# Patient Record
Sex: Female | Born: 1980 | Race: White | Hispanic: No | Marital: Married | Smoking: Never smoker
Health system: Southern US, Community
[De-identification: ages and names within clinical notes are randomized; demographics above are authoritative.]

## PROBLEM LIST (undated history)

## (undated) DIAGNOSIS — Z789 Other specified health status: Secondary | ICD-10-CM

## (undated) HISTORY — DX: Other specified health status: Z78.9

---

## 2008-12-15 HISTORY — PX: WISDOM TOOTH EXTRACTION: SHX21

## 2021-08-22 ENCOUNTER — Ambulatory Visit (INDEPENDENT_AMBULATORY_CARE_PROVIDER_SITE_OTHER): Payer: No Typology Code available for payment source | Admitting: Family Medicine

## 2021-08-22 ENCOUNTER — Encounter: Payer: Self-pay | Admitting: Family Medicine

## 2021-08-22 ENCOUNTER — Encounter: Payer: Self-pay | Admitting: General Practice

## 2021-08-22 ENCOUNTER — Other Ambulatory Visit: Payer: Self-pay

## 2021-08-22 VITALS — BP 135/74 | HR 75 | Ht 66.0 in | Wt 163.0 lb

## 2021-08-22 DIAGNOSIS — Z7689 Persons encountering health services in other specified circumstances: Secondary | ICD-10-CM | POA: Diagnosis not present

## 2021-08-22 DIAGNOSIS — L720 Epidermal cyst: Secondary | ICD-10-CM

## 2021-08-22 NOTE — Progress Notes (Signed)
Patient establishing care Patient had annual visit with [facility] on 06-12-21. Armandina Stammer RN

## 2021-08-22 NOTE — Progress Notes (Signed)
   Subjective:    Patient ID: Pam Solis, female    DOB: 04-Feb-1981, 40 y.o.   MRN: [id number]  HPI Patient is here for to establish care.  She had just had a appointment with an OB/GYN with [facility], but found that they do not cover their insurance.  She reports history of 2 normal vaginal deliveries without difficulties.  She and her husband are looking at becoming pregnant within the next year.  She was on early vaginal progesterone with both of her other pregnancies due to history of recurrent pregnancy loss.  She does have a small lump in the left outside area of her breast close to her left armpit.  Her previous doctor had been following it.  She did have an episode of a becoming inflamed couple months ago.  It is painless, mobile, about 7 to 8 mm in diameter.  I have reviewed the patients past medical, family, and social history.  I have reviewed the patient's medication list and allergies.   Review of Systems     Objective:   Physical Exam Vitals and nursing note reviewed.  Constitutional:      Appearance: Normal appearance.  Chest:    Neurological:     Mental Status: She is alert.       Assessment & Plan:  1. Encounter to establish care Discussed pregnancy care.  Start prenatal vitamin.  Discussed role of vaginal progesterone with that there is no conclusive evidence that it is effective at preventing recurrent pregnancy loss.  However, if the patient is desirous, there is little harm with vaginal progesterone.  2. Inclusion cyst of left breast Reassured patient that this is a benign process and is not related to breast cancer.

## 2021-11-28 ENCOUNTER — Encounter: Payer: Self-pay | Admitting: Family Medicine

## 2021-11-29 MED ORDER — PROGESTERONE 200 MG PO CAPS: ORAL_CAPSULE | ORAL | 1 refills | Status: DC

## 2021-12-15 NOTE — L&D Delivery Note (Addendum)
Delivery Note At 0016, patient progressed to complete and started pushing. At 12:20 AM a viable and healthy female was delivered via Vaginal, Spontaneous (Presentation: Right Occiput Posterior with noted right compound arm).  APGAR: 9, 9; weight pending  .   Placenta status: Spontaneous, Intact.  Cord: 3 vessels with the following complications: Short.    Anesthesia: Local Lidocaine 30 cc Episiotomy: None Lacerations: 2nd degree Suture Repair: 3.0 vicryl in which the laceration was repaired in the usual fashion  Est. Blood Loss (mL): 500 (from lac)  Fundus was firm and bleeding minimal when delivery team left the room  Mom to postpartum.  Baby to Couplet care / Skin to Skin.  Ilda Basset [facility] PGY3 Visiting Resident 08/06/2022, 1:20 AM   DELIVERY ATTESTATION I was gloved and present for the delivery in its entirety, and I agree with the above resident/SNM's note.    Cheral Marker CNM, [facility] 08/06/2022 1:46 AM

## 2022-01-02 ENCOUNTER — Other Ambulatory Visit (HOSPITAL_COMMUNITY)
Admission: RE | Admit: 2022-01-02 | Discharge: 2022-01-02 | Disposition: A | Discharge status: Home / Self Care | Payer: No Typology Code available for payment source | Source: Ambulatory Visit | Attending: Family Medicine | Admitting: Family Medicine

## 2022-01-02 ENCOUNTER — Encounter: Payer: Self-pay | Admitting: Family Medicine

## 2022-01-02 ENCOUNTER — Other Ambulatory Visit: Payer: Self-pay

## 2022-01-02 ENCOUNTER — Ambulatory Visit (INDEPENDENT_AMBULATORY_CARE_PROVIDER_SITE_OTHER): Payer: No Typology Code available for payment source | Admitting: Family Medicine

## 2022-01-02 VITALS — BP 123/68 | HR 79 | Wt 161.0 lb

## 2022-01-02 DIAGNOSIS — Z3A09 9 weeks gestation of pregnancy: Secondary | ICD-10-CM | POA: Insufficient documentation

## 2022-01-02 DIAGNOSIS — O09529 Supervision of elderly multigravida, unspecified trimester: Secondary | ICD-10-CM | POA: Insufficient documentation

## 2022-01-02 DIAGNOSIS — O0990 Supervision of high risk pregnancy, unspecified, unspecified trimester: Secondary | ICD-10-CM | POA: Insufficient documentation

## 2022-01-02 NOTE — Progress Notes (Signed)
Subjective:  Pam Solis is a [id number] [redacted]w[redacted]d being seen today for her first obstetrical visit.  Her obstetrical history is significant for advanced maternal age. 2 prior vaginal deliveries - uncomplicated. Patient does intend to breast feed. Pregnancy history fully reviewed.  Patient reports nausea and vomiting.  BP 123/68    Pulse 79    Wt 161 lb (73 kg)    LMP 10/27/2021    BMI 25.99 kg/m   HISTORY: OB History  Gravida Para Term Preterm AB Living  5 2 2   2 2   SAB IAB Ectopic Multiple Live Births  2       2    # Outcome Date GA Lbr Len/2nd Weight Sex Delivery Anes PTL Lv  5 Current           4 Term 09/02/19    M Vag-Spont     3 Term 12/16/17    F Vag-Spont     2 SAB           1 SAB             Past Medical History:  Diagnosis Date   Medical history non-contributory     Past Surgical History:  Procedure Laterality Date   WISDOM TOOTH EXTRACTION  2010    Family History  Problem Relation Age of Onset   Diabetes Paternal Grandfather    Diabetes Paternal Grandmother    Breast cancer Paternal Grandmother    Cancer Paternal 2011' disease Mother    Cancer Maternal Uncle      Exam  BP 123/68    Pulse 79    Wt 161 lb (73 kg)    LMP 10/27/2021    BMI 25.99 kg/m   Chaperone present during exam  CONSTITUTIONAL: Well-developed, well-nourished female in no acute distress.  HENT:  Normocephalic, atraumatic, External right and left ear normal. Oropharynx is clear and moist EYES: Conjunctivae and EOM are normal. Pupils are equal, round, and reactive to light. No scleral icterus.  NECK: Normal range of motion, supple, no masses.  Normal thyroid.  CARDIOVASCULAR: Normal heart rate noted, regular rhythm RESPIRATORY: Clear to auscultation bilaterally. Effort and breath sounds normal, no problems with respiration noted. BREASTS: had recent breast exam with me ABDOMEN: Soft, normal bowel sounds, no distention noted.  No tenderness, rebound or guarding.   PELVIC: had pelvic exam and PAP earlier this year. MUSCULOSKELETAL: Normal range of motion. No tenderness.  No cyanosis, clubbing, or edema.  2+ distal pulses. SKIN: Skin is warm and dry. No rash noted. Not diaphoretic. No erythema. No pallor. NEUROLOGIC: Alert and oriented to person, place, and time. Normal reflexes, muscle tone coordination. No cranial nerve deficit noted. PSYCHIATRIC: Normal mood and affect. Normal behavior. Normal judgment and thought content.    Assessment:    Pregnancy: 10/29/2021 Patient Active Problem List   Diagnosis Date Noted   Supervision of high risk pregnancy, antepartum 01/02/2022   Antepartum multigravida of advanced maternal age 30/19/2023      Plan:   1. Antepartum multigravida of advanced maternal age Start ASA 81mg  at 12 weeks - Culture, OB Urine - 01/04/2022 MFM OB DETAIL +14 WK; Future - CBC/D/Plt+RPR+Rh+ABO+RubIgG... - GC/Chlamydia probe amp ([place])not at [facility]  2. Supervision of high risk pregnancy, antepartum FHT and FH noraml - Culture, OB Urine - [place] MFM OB DETAIL +14 WK; Future - CBC/D/Plt+RPR+Rh+ABO+RubIgG... - GC/Chlamydia probe amp ([place])not at [facility]  3. [redacted] weeks gestation of pregnancy - Culture, OB Urine -  [place] MFM OB DETAIL +14 WK; Future - CBC/D/Plt+RPR+Rh+ABO+RubIgG... - GC/Chlamydia probe amp ([place])not at [facility]    Initial labs obtained Continue prenatal vitamins Reviewed n/v relief measures and warning s/s to report Reviewed recommended weight gain based on pre-gravid BMI Encouraged well-balanced diet Genetic & carrier screening discussed: Looking into NIPS out of pocket. Ultrasound discussed; fetal survey: requested CCNC completed> form faxed if has or is planning to apply for medicaid The nature of [place] [facility] [employer] with multiple MDs and other Advanced Practice Providers was explained to patient; also emphasized that fellows, residents, and students are part of our team.  Problem list  reviewed and updated. 75% of 30 min visit spent on counseling and coordination of care.     Pam Solis 01/02/2022

## 2022-01-02 NOTE — Progress Notes (Signed)
DATING AND VIABILITY SONOGRAM   Pam Solis is a 41 y.o. year old G5P2022 with LMP Patient's last menstrual period was 10/27/2021. which would correlate to  [redacted]w[redacted]d weeks gestation.  She has regular menstrual cycles.   She is here today for a confirmatory initial sonogram.    GESTATION: SINGLETON     FETAL ACTIVITY:          Heart rate         161 bpm          The fetus is active.  PLACENTA LOCALIZATION:  anterior    ADNEXA: The ovaries are normal.   GESTATIONAL AGE AND  BIOMETRICS:  Gestational criteria: Estimated Date of Delivery: 08/03/22 by LMP now at [redacted]w[redacted]d  Previous Scans:0  CROWN RUMP LENGTH           3.14 cm         10weeks         2.80 cm       9-4 weeks                                                                           AVERAGE EGA(BY THIS SCAN):  9-4 weeks  WORKING EDD( LMP ):  08/03/2022     TECHNICIAN COMMENTS:  Patient informed that the ultrasound is considered a limited obstetric ultrasound and is not intended to be a complete ultrasound exam.  Patient also informed that the ultrasound is not being completed with the intent of assessing for fetal or placental anomalies or any pelvic abnormalities. Explained that the purpose of today's ultrasound is to assess for fetal heart rate.  Patient acknowledges the purpose of the exam and the limitations of the study.     Armandina Stammer 01/02/2022 9:43 AM

## 2022-01-03 LAB — CBC/D/PLT+RPR+RH+ABO+RUBIGG...
Antibody Screen: NEGATIVE
Basophils Absolute: 0 10*3/uL (ref 0.0–0.2)
Basos: 0 %
EOS (ABSOLUTE): 0 10*3/uL (ref 0.0–0.4)
Eos: 0 %
HCV Ab: <0.1 s/co ratio (ref 0.0–0.9)
HIV Screen 4th Generation wRfx: NONREACTIVE
Hematocrit: 34.5 % (ref 34.0–46.6)
Hemoglobin: 11 g/dL — ABNORMAL LOW (ref 11.1–15.9)
Hepatitis B Surface Ag: NEGATIVE
Immature Grans (Abs): 0 10*3/uL (ref 0.0–0.1)
Immature Granulocytes: 0 %
Lymphocytes Absolute: 0.9 10*3/uL (ref 0.7–3.1)
Lymphs: 17 %
MCH: 22.8 pg — ABNORMAL LOW (ref 26.6–33.0)
MCHC: 31.9 g/dL (ref 31.5–35.7)
MCV: 71 fL — ABNORMAL LOW (ref 79–97)
Monocytes Absolute: 0.4 10*3/uL (ref 0.1–0.9)
Monocytes: 7 %
Neutrophils Absolute: 4.1 10*3/uL (ref 1.4–7.0)
Neutrophils: 76 %
Platelets: 304 10*3/uL (ref 150–450)
RBC: 4.83 x10E6/uL (ref 3.77–5.28)
RDW: 17.8 % — ABNORMAL HIGH (ref 11.7–15.4)
RPR Ser Ql: NONREACTIVE
Rh Factor: NEGATIVE
Rubella Antibodies, IGG: 10.3 index (ref >0.99)
WBC: 5.5 10*3/uL (ref 3.4–10.8)

## 2022-01-03 LAB — GC/CHLAMYDIA PROBE AMP (~~LOC~~) NOT AT ARMC
Chlamydia: NEGATIVE
Comment: NEGATIVE
Comment: NORMAL
Neisseria Gonorrhea: NEGATIVE

## 2022-01-03 LAB — HCV INTERPRETATION

## 2022-01-04 LAB — URINE CULTURE, OB REFLEX

## 2022-01-04 LAB — CULTURE, OB URINE

## 2022-01-31 ENCOUNTER — Other Ambulatory Visit: Payer: Self-pay

## 2022-01-31 ENCOUNTER — Ambulatory Visit (INDEPENDENT_AMBULATORY_CARE_PROVIDER_SITE_OTHER): Payer: No Typology Code available for payment source | Admitting: Family Medicine

## 2022-01-31 VITALS — BP 100/73 | HR 95 | Wt 164.0 lb

## 2022-01-31 DIAGNOSIS — O0990 Supervision of high risk pregnancy, unspecified, unspecified trimester: Secondary | ICD-10-CM

## 2022-01-31 DIAGNOSIS — O09529 Supervision of elderly multigravida, unspecified trimester: Secondary | ICD-10-CM

## 2022-01-31 DIAGNOSIS — Z3A13 13 weeks gestation of pregnancy: Secondary | ICD-10-CM

## 2022-01-31 NOTE — Progress Notes (Signed)
° °  PRENATAL VISIT NOTE  Subjective:  Pam Solis is a 41 y.o. [id number] at [redacted]w[redacted]d being seen today for ongoing prenatal care.  She is currently monitored for the following issues for this high-risk pregnancy and has Supervision of high risk pregnancy, antepartum and Antepartum multigravida of advanced maternal age on their problem list.  Patient reports no complaints.  Contractions: Not present. Vag. Bleeding: None.   . Denies leaking of fluid.   The following portions of the patient's history were reviewed and updated as appropriate: allergies, current medications, past family history, past medical history, past social history, past surgical history and problem list.   Objective:   Vitals:   01/31/22 0844  BP: 100/73  Pulse: 95  Weight: 164 lb (74.4 kg)    Fetal Status:           General:  Alert, oriented and cooperative. Patient is in no acute distress.  Skin: Skin is warm and dry. No rash noted.   Cardiovascular: Normal heart rate noted  Respiratory: Normal respiratory effort, no problems with respiration noted  Abdomen: Soft, gravid, appropriate for gestational age.  Pain/Pressure: Absent     Pelvic: Cervical exam deferred        Extremities: Normal range of motion.  Edema: None  Mental Status: Normal mood and affect. Normal behavior. Normal judgment and thought content.   Assessment and Plan:  Pregnancy: [id number] at [redacted]w[redacted]d 1. [redacted] weeks gestation of pregnancy  2. Antepartum multigravida of advanced maternal age 13 and FH normal Start ASA 81mg   3. Supervision of high risk pregnancy, antepartum   Preterm labor symptoms and general obstetric precautions including but not limited to vaginal bleeding, contractions, leaking of fluid and fetal movement were reviewed in detail with the patient. Please refer to After Visit Summary for other counseling recommendations.   No follow-ups on file.  Future Appointments  Date Time Provider [place]  02/28/2022  8:35 AM  Truett Mainland, DO [facility] None  03/12/2022  2:30 PM [facility] NURSE [facility]  03/12/2022  2:45 PM [facility] US4 [facility]  03/28/2022  8:55 AM Anyanwu, Sallyanne Havers, MD [facility] None    Truett Mainland, DO

## 2022-02-25 ENCOUNTER — Encounter: Payer: Self-pay | Admitting: Family Medicine

## 2022-02-28 ENCOUNTER — Encounter: Payer: No Typology Code available for payment source | Admitting: Family Medicine

## 2022-03-12 ENCOUNTER — Ambulatory Visit: Payer: No Typology Code available for payment source | Admitting: *Deleted

## 2022-03-12 ENCOUNTER — Ambulatory Visit: Payer: No Typology Code available for payment source | Attending: Family Medicine

## 2022-03-12 VITALS — BP 122/74 | HR 78

## 2022-03-12 DIAGNOSIS — Z3A09 9 weeks gestation of pregnancy: Secondary | ICD-10-CM | POA: Diagnosis not present

## 2022-03-12 DIAGNOSIS — O0990 Supervision of high risk pregnancy, unspecified, unspecified trimester: Secondary | ICD-10-CM | POA: Diagnosis not present

## 2022-03-12 DIAGNOSIS — O09522 Supervision of elderly multigravida, second trimester: Secondary | ICD-10-CM

## 2022-03-12 DIAGNOSIS — O09529 Supervision of elderly multigravida, unspecified trimester: Secondary | ICD-10-CM | POA: Diagnosis not present

## 2022-03-12 IMAGING — US US MFM OB DETAIL+14 WK
1 series · 13 of 28 positions shown · non-contrast
Comparison: none

[Series 1: us mfm ob detail+14 wk · 13 of 114 slices shown]
[im 5/114]
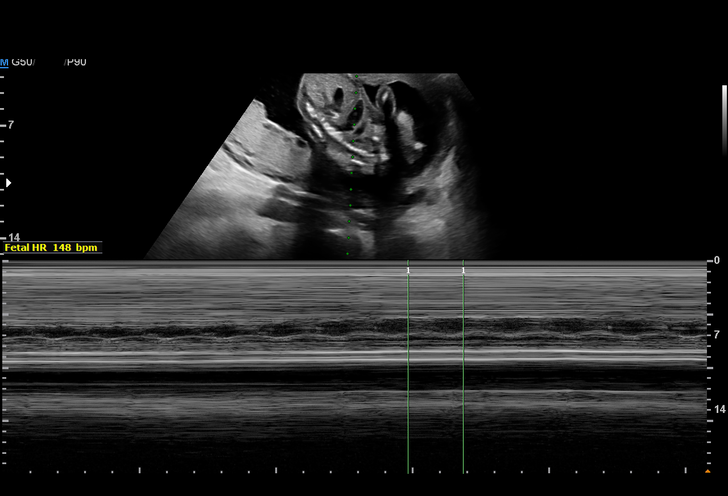
[im 13/114]
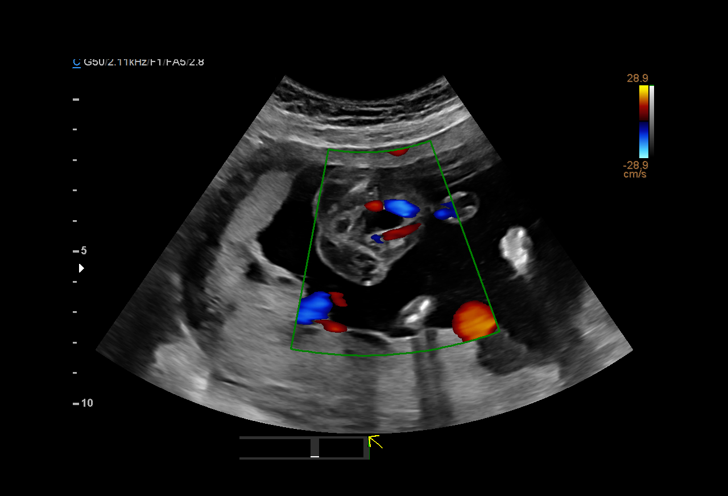
[im 21/114]
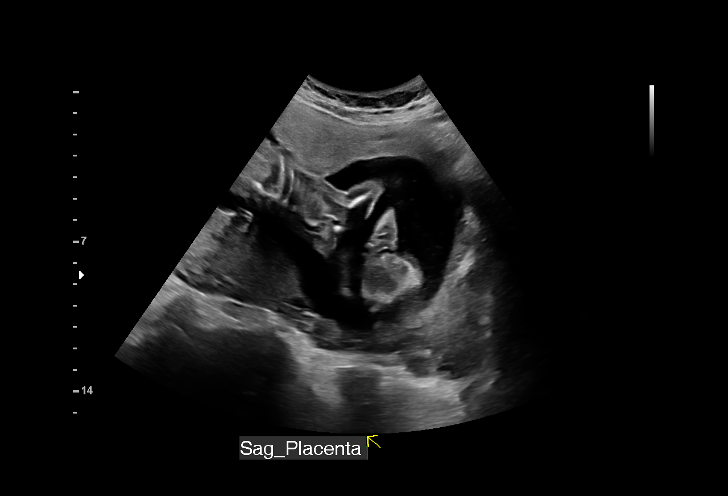
[im 30/114]
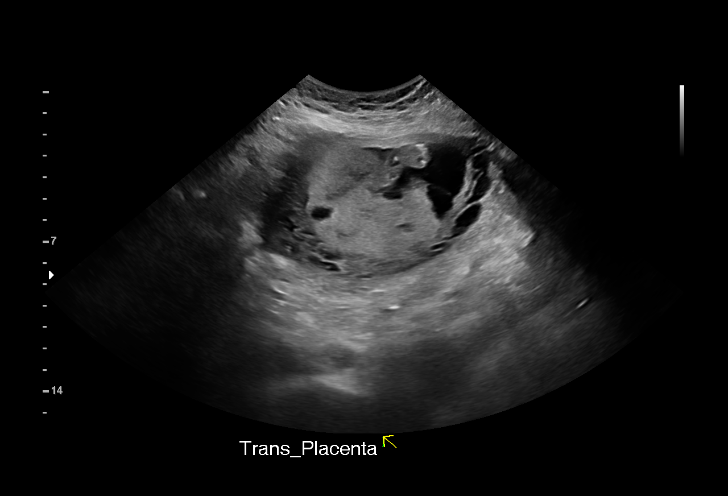
[im 38/114]
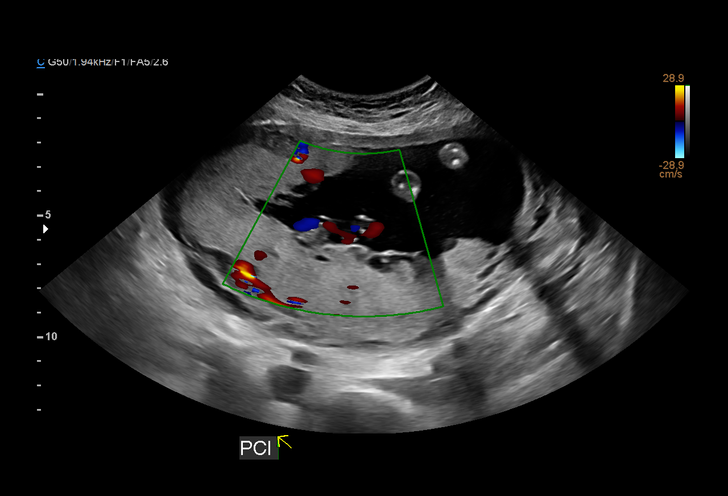
[im 47/114]
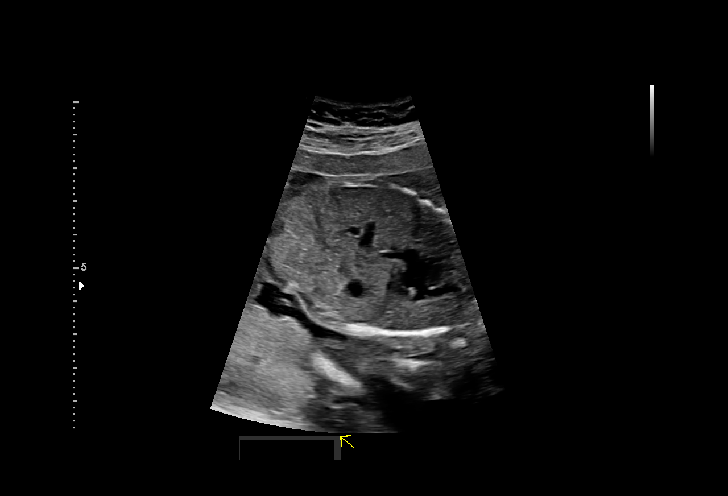
[im 59/114]
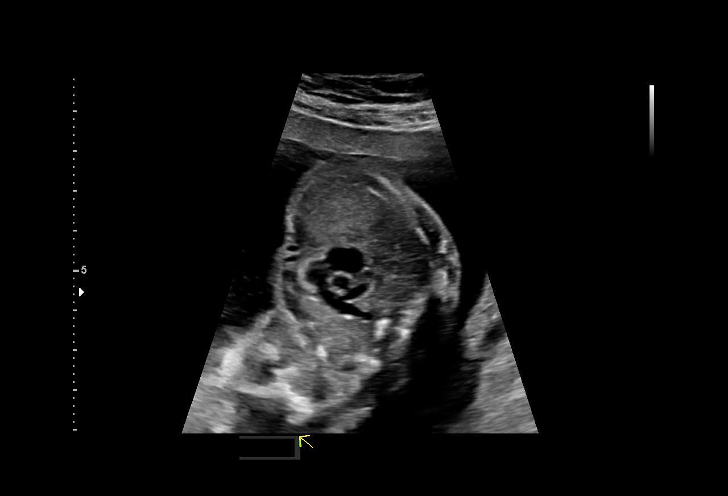
[im 67/114]
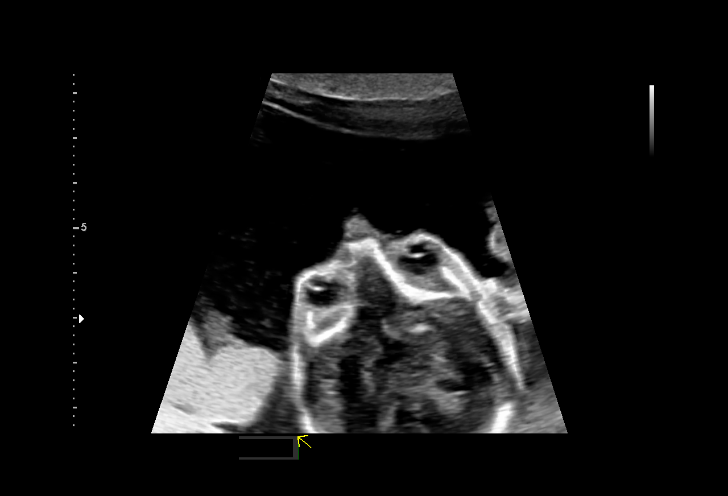
[im 76/114]
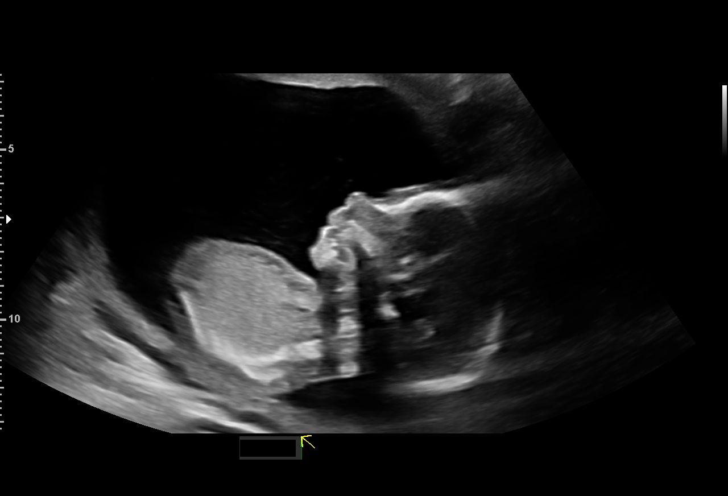
[im 84/114]
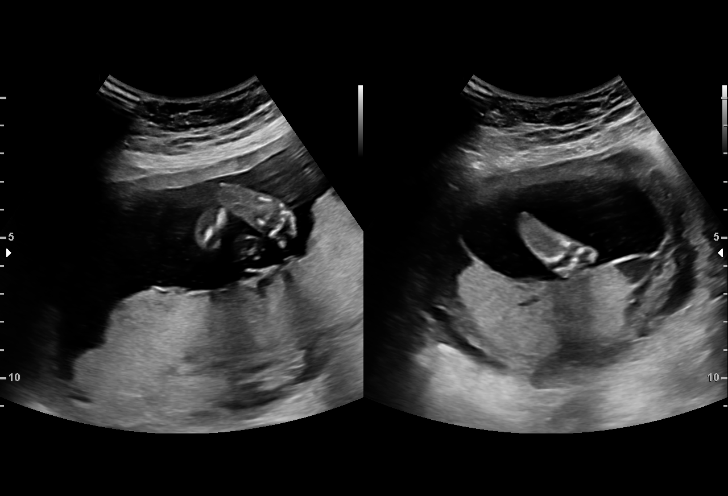
[im 93/114]
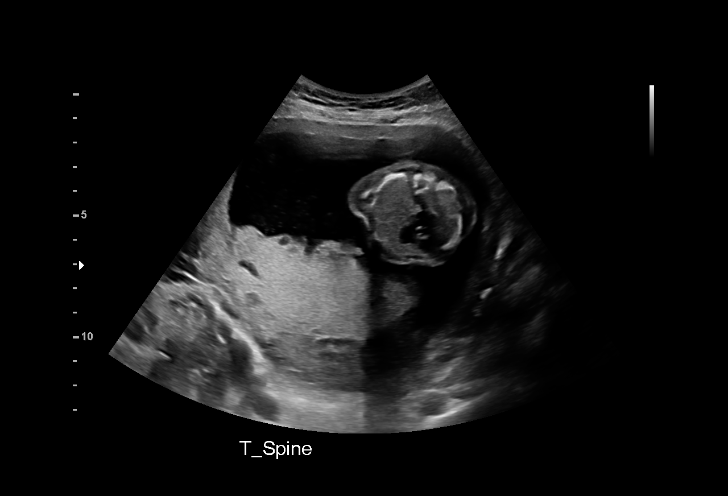
[im 101/114]
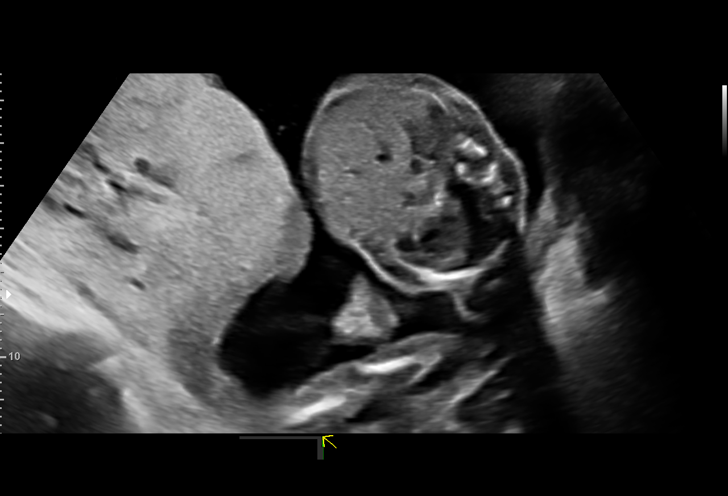
[im 109/114]
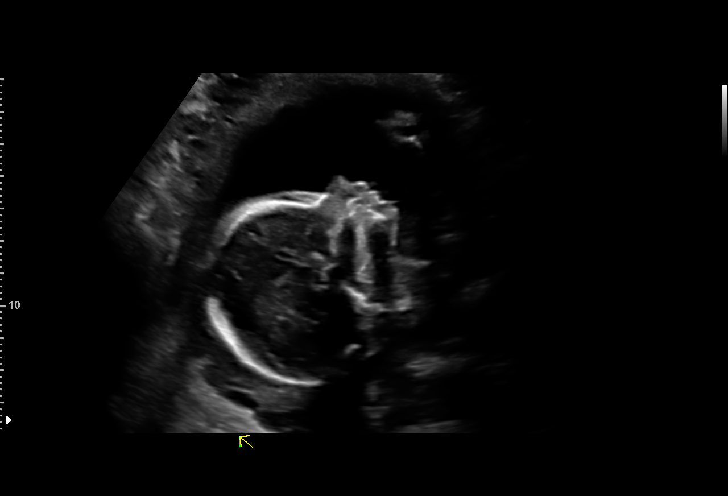

[13 of 28 positions shown; findings below may reference images not displayed]

Indications

 Advanced maternal age multigravida 40,         [1F]
 second trimester
 19 weeks gestation of pregnancy
 Antenatal screening for malformations          [1F]
 Declined genetic screening
Fetal Evaluation

 Num Of Fetuses:         1
 Fetal Heart Rate(bpm):  148
 Cardiac Activity:       Observed
 Presentation:           Cephalic
 Placenta:               Posterior
 P. Cord Insertion:      Visualized, central

 Amniotic Fluid
 AFI FV:      Within normal limits

                             Largest Pocket(cm)

Biometry

 BPD:      46.3  mm     G. Age:  20w 0d         74  %    CI:        71.12   %    70 - 86
                                                         FL/HC:      17.3   %    16.1 -
 HC:      174.9  mm     G. Age:  20w 0d         70  %    HC/AC:      1.10        1.09 -
 AC:      158.3  mm     G. Age:  21w 0d         89  %    FL/BPD:     65.2   %
 FL:       30.2  mm     G. Age:  19w 2d         39  %    FL/AC:      19.1   %    20 - 24
 HUM:      30.8  mm     G. Age:  20w 2d         72  %
 CER:      20.5  mm     G. Age:  19w 5d         64  %
 NFT:       4.0  mm
 LV:        5.6  mm
 CM:          4  mm

 Est. FW:     338  gm    0 lb 12 oz      86  %
OB History

 Gravidity:    5         Term:   2        Prem:   0        SAB:   2
 TOP:          0       Ectopic:  0        Living: 2
Gestational Age

 LMP:           19w 3d        Date:  [DATE]                 EDD:   [DATE]
 U/S Today:     20w 1d                                        EDD:   [DATE]
 Best:          19w 3d     Det. By:  LMP  ([DATE])          EDD:   [DATE]
Anatomy

 Cranium:               Appears normal         Aortic Arch:            Appears normal
 Cavum:                 Appears normal         Ductal Arch:            Not well visualized
 Ventricles:            Appears normal         Diaphragm:              Appears normal
 Choroid Plexus:        Appears normal         Stomach:                Appears normal, left
                                                                       sided
 Cerebellum:            Appears normal         Abdomen:                Appears normal
 Posterior Fossa:       Appears normal         Abdominal Wall:         Appears nml (cord
                                                                       insert, abd wall)
 Nuchal Fold:           Appears normal         Cord Vessels:           Appears normal (3
                                                                       vessel cord)
 Face:                  Appears normal         Kidneys:                Appear normal
                        (orbits and profile)
 Lips:                  Appears normal         Bladder:                Appears normal
 Thoracic:              Appears normal         Spine:                  Appears normal
 Heart:                 Appears normal; EIF    Upper Extremities:      Appears normal
 RVOT:                  Appears normal         Lower Extremities:      Appears normal
 LVOT:                  Appears normal

 Other:  Lenses, nasal bone, maxilla, mandible, 3VV, heels/feet and Right
         open hand/5th digit visualized. Fetus appears to be female.
         Technically difficult due to fetal position.
Cervix Uterus Adnexa

 Cervix
 Length:            3.2  cm.
 Normal appearance by transabdominal scan.

 Uterus
 No abnormality visualized.

 Right Ovary
 Not visualized.

 Left Ovary
 Within normal limits.

 Cul De Sac
 No free fluid seen.
 Adnexa
 No abnormality visualized.
Impression

 G5 P2. Advanced maternal age.  Patient had opted not to
 screen for fetal aneuploidies.
 Obstetric history significant for 2 term vaginal deliveries.  She
 reports no chronic medical conditions.

 We performed fetal anatomy scan. An echogenic intracardiac
 focus is seen. No other makers of aneuploidies or fetal
 structural defects are seen. Fetal biometry is consistent with
 her previously-established dates. Amniotic fluid is normal and
 good fetal activity is seen
 I counseled the patient that echogenic intracardiac focus is
 seen in 3% to 4% of normal fetuses and it is a marker for
 Down syndrome.  Advanced maternal age is associated with
 increased risk for Down syndrome.  I discussed the
 significance and limitations of cell free fetal DNA screening
 that has a greater detection rate for Down syndrome.  I
 recommended cell-free fetal DNA screening.
 Alternatively, she can opt for amniocentesis.  I explained
 amniocentesis procedure and possible complication of
 miscarriage (1 and 500 procedures).

 Patient opted not to have amniocentesis.  She will discuss
 with her husband and decide on cell-free fetal DNA screening
 and communicate to our genetic counselor.
Recommendations

 -An appointment was made for her to return in 12 weeks for
 fetal growth assessment.
                YALKYM

## 2022-03-13 ENCOUNTER — Other Ambulatory Visit: Payer: Self-pay | Admitting: *Deleted

## 2022-03-13 DIAGNOSIS — O09522 Supervision of elderly multigravida, second trimester: Secondary | ICD-10-CM

## 2022-03-28 ENCOUNTER — Encounter: Payer: No Typology Code available for payment source | Admitting: Obstetrics & Gynecology

## 2022-05-02 ENCOUNTER — Ambulatory Visit (INDEPENDENT_AMBULATORY_CARE_PROVIDER_SITE_OTHER): Payer: No Typology Code available for payment source | Admitting: Family Medicine

## 2022-05-02 VITALS — BP 92/55 | HR 75 | Wt 176.0 lb

## 2022-05-02 DIAGNOSIS — O09522 Supervision of elderly multigravida, second trimester: Secondary | ICD-10-CM

## 2022-05-02 DIAGNOSIS — O0992 Supervision of high risk pregnancy, unspecified, second trimester: Secondary | ICD-10-CM

## 2022-05-02 DIAGNOSIS — O09529 Supervision of elderly multigravida, unspecified trimester: Secondary | ICD-10-CM

## 2022-05-02 DIAGNOSIS — O0990 Supervision of high risk pregnancy, unspecified, unspecified trimester: Secondary | ICD-10-CM

## 2022-05-02 DIAGNOSIS — Z3A26 26 weeks gestation of pregnancy: Secondary | ICD-10-CM

## 2022-05-02 NOTE — Progress Notes (Signed)
   PRENATAL VISIT NOTE  Subjective:  Pam Solis is a 41 y.o. [id number] at [redacted]w[redacted]d being seen today for ongoing prenatal care.  She is currently monitored for the following issues for this high-risk pregnancy and has Supervision of high risk pregnancy, antepartum and Antepartum multigravida of advanced maternal age on their problem list.  Patient reports no complaints.  Contractions: Not present. Vag. Bleeding: None.  Movement: Present. Denies leaking of fluid.   The following portions of the patient's history were reviewed and updated as appropriate: allergies, current medications, past family history, past medical history, past social history, past surgical history and problem list.   Objective:   Vitals:   05/02/22 0830  BP: (!) 92/55  Pulse: 75  Weight: 176 lb (79.8 kg)    Fetal Status: Fetal Heart Rate (bpm): 142 Fundal Height: 27 cm Movement: Present     General:  Alert, oriented and cooperative. Patient is in no acute distress.  Skin: Skin is warm and dry. No rash noted.   Cardiovascular: Normal heart rate noted  Respiratory: Normal respiratory effort, no problems with respiration noted  Abdomen: Soft, gravid, appropriate for gestational age.  Pain/Pressure: Absent     Pelvic: Cervical exam deferred        Extremities: Normal range of motion.  Edema: None  Mental Status: Normal mood and affect. Normal behavior. Normal judgment and thought content.   Assessment and Plan:  Pregnancy: [id number] at [redacted]w[redacted]d 1. [redacted] weeks gestation of pregnancy - RPR - CBC - Glucose Tolerance, 2 Hours w/1 Hour - HIV Antibody (routine testing w rflx)  2. Supervision of high risk pregnancy, antepartum FHT and FH normal. Has f/u US on 6/23 for LVEF  3. Antepartum multigravida of advanced maternal age ASA 81mg .  Preterm labor symptoms and general obstetric precautions including but not limited to vaginal bleeding, contractions, leaking of fluid and fetal movement were reviewed in detail with the  patient. Please refer to After Visit Summary for other counseling recommendations.   Return in about 4 weeks (around 05/30/2022) for [redacted] week gestation.  Future Appointments  Date Time Provider [place]  05/02/2022 10:15 AM Truett Mainland, DO [facility] None  06/06/2022  9:15 AM [facility] NURSE [facility]  06/06/2022  9:30 AM [facility] US3 [facility] [place], DO

## 2022-05-03 LAB — CBC
Hematocrit: 32.5 % — ABNORMAL LOW (ref 34.0–46.6)
Hemoglobin: 10 g/dL — ABNORMAL LOW (ref 11.1–15.9)
MCH: 22.5 pg — ABNORMAL LOW (ref 26.6–33.0)
MCHC: 30.8 g/dL — ABNORMAL LOW (ref 31.5–35.7)
MCV: 73 fL — ABNORMAL LOW (ref 79–97)
Platelets: 276 10*3/uL (ref 150–450)
RBC: 4.44 x10E6/uL (ref 3.77–5.28)
RDW: 15.4 % (ref 11.7–15.4)
WBC: 7.9 10*3/uL (ref 3.4–10.8)

## 2022-05-03 LAB — HIV ANTIBODY (ROUTINE TESTING W REFLEX): HIV Screen 4th Generation wRfx: NONREACTIVE

## 2022-05-03 LAB — GLUCOSE TOLERANCE, 2 HOURS W/ 1HR
Glucose, 1 hour: 127 mg/dL (ref 70–179)
Glucose, 2 hour: 107 mg/dL (ref 70–152)
Glucose, Fasting: 80 mg/dL (ref 70–91)

## 2022-05-03 LAB — SYPHILIS: RPR W/REFLEX TO RPR TITER AND TREPONEMAL ANTIBODIES, TRADITIONAL SCREENING AND DIAGNOSIS ALGORITHM: RPR Ser Ql: NONREACTIVE

## 2022-05-30 ENCOUNTER — Ambulatory Visit (INDEPENDENT_AMBULATORY_CARE_PROVIDER_SITE_OTHER): Payer: No Typology Code available for payment source | Admitting: Family Medicine

## 2022-05-30 VITALS — BP 124/61 | HR 86 | Wt 178.0 lb

## 2022-05-30 DIAGNOSIS — O09529 Supervision of elderly multigravida, unspecified trimester: Secondary | ICD-10-CM

## 2022-05-30 DIAGNOSIS — O0993 Supervision of high risk pregnancy, unspecified, third trimester: Secondary | ICD-10-CM

## 2022-05-30 DIAGNOSIS — O09523 Supervision of elderly multigravida, third trimester: Secondary | ICD-10-CM

## 2022-05-30 DIAGNOSIS — O0990 Supervision of high risk pregnancy, unspecified, unspecified trimester: Secondary | ICD-10-CM

## 2022-05-30 DIAGNOSIS — Z3A30 30 weeks gestation of pregnancy: Secondary | ICD-10-CM

## 2022-05-30 NOTE — Progress Notes (Signed)
   PRENATAL VISIT NOTE  Subjective:  Pam Solis is a 41 y.o. [id number] at [redacted]w[redacted]d being seen today for ongoing prenatal care.  She is currently monitored for the following issues for this high-risk pregnancy and has Supervision of high risk pregnancy, antepartum and Antepartum multigravida of advanced maternal age on their problem list.  Patient reports no complaints.  Contractions: Not present. Vag. Bleeding: None.  Movement: Present. Denies leaking of fluid.   The following portions of the patient's history were reviewed and updated as appropriate: allergies, current medications, past family history, past medical history, past social history, past surgical history and problem list.   Objective:   Vitals:   05/30/22 0808  BP: 124/61  Pulse: 86  Weight: 178 lb (80.7 kg)    Fetal Status: Fetal Heart Rate (bpm): 140   Movement: Present     General:  Alert, oriented and cooperative. Patient is in no acute distress.  Skin: Skin is warm and dry. No rash noted.   Cardiovascular: Normal heart rate noted  Respiratory: Normal respiratory effort, no problems with respiration noted  Abdomen: Soft, gravid, appropriate for gestational age.  Pain/Pressure: Absent     Pelvic: Cervical exam deferred        Extremities: Normal range of motion.  Edema: None  Mental Status: Normal mood and affect. Normal behavior. Normal judgment and thought content.   Assessment and Plan:  Pregnancy: [id number] at [redacted]w[redacted]d  1. Supervision of high risk pregnancy, antepartum FHT and FH normal  2. Antepartum multigravida of advanced maternal age ASA 81mg    Preterm labor symptoms and general obstetric precautions including but not limited to vaginal bleeding, contractions, leaking of fluid and fetal movement were reviewed in detail with the patient. Please refer to After Visit Summary for other counseling recommendations.   No follow-ups on file.  Future Appointments  Date Time Provider Department Center   06/06/2022  9:15 AM [facility] NURSE [facility]  06/06/2022  9:30 AM [facility] US3 [facility]  06/13/2022  9:35 AM 06/15/2022, DO [facility] None  06/27/2022  9:15 AM 06/29/2022, MD [facility] None  07/11/2022 10:55 AM Anyanwu, 07/13/2022, MD [facility] None  07/18/2022  8:55 AM Anyanwu, 09/17/2022, MD [facility] None  07/25/2022  8:35 AM 09/24/2022, DO [facility] None  08/01/2022  9:15 AM 08/03/2022, DO [facility] None    Levie Heritage, DO

## 2022-06-06 ENCOUNTER — Ambulatory Visit: Payer: No Typology Code available for payment source | Attending: Obstetrics and Gynecology

## 2022-06-06 ENCOUNTER — Ambulatory Visit: Payer: No Typology Code available for payment source | Admitting: *Deleted

## 2022-06-06 VITALS — BP 112/60 | HR 80

## 2022-06-06 DIAGNOSIS — O09522 Supervision of elderly multigravida, second trimester: Secondary | ICD-10-CM | POA: Diagnosis present

## 2022-06-06 DIAGNOSIS — O283 Abnormal ultrasonic finding on antenatal screening of mother: Secondary | ICD-10-CM | POA: Diagnosis present

## 2022-06-13 ENCOUNTER — Ambulatory Visit (INDEPENDENT_AMBULATORY_CARE_PROVIDER_SITE_OTHER): Payer: No Typology Code available for payment source | Admitting: Family Medicine

## 2022-06-13 VITALS — BP 98/57 | HR 74 | Wt 183.0 lb

## 2022-06-13 DIAGNOSIS — O0990 Supervision of high risk pregnancy, unspecified, unspecified trimester: Secondary | ICD-10-CM

## 2022-06-13 DIAGNOSIS — Z3A32 32 weeks gestation of pregnancy: Secondary | ICD-10-CM

## 2022-06-13 DIAGNOSIS — O09529 Supervision of elderly multigravida, unspecified trimester: Secondary | ICD-10-CM

## 2022-06-13 NOTE — Progress Notes (Signed)
   PRENATAL VISIT NOTE  Subjective:  Pam Solis is a 41 y.o. [id number] at [redacted]w[redacted]d being seen today for ongoing prenatal care.  She is currently monitored for the following issues for this high-risk pregnancy and has Supervision of high risk pregnancy, antepartum and Antepartum multigravida of advanced maternal age on their problem list.  Patient reports no complaints.  Contractions: Not present. Vag. Bleeding: None.  Movement: Present. Denies leaking of fluid.   The following portions of the patient's history were reviewed and updated as appropriate: allergies, current medications, past family history, past medical history, past social history, past surgical history and problem list.   Objective:   Vitals:   06/13/22 0925  BP: (!) 98/57  Pulse: 74  Weight: 183 lb (83 kg)    Fetal Status: Fetal Heart Rate (bpm): 128   Movement: Present     General:  Alert, oriented and cooperative. Patient is in no acute distress.  Skin: Skin is warm and dry. No rash noted.   Cardiovascular: Normal heart rate noted  Respiratory: Normal respiratory effort, no problems with respiration noted  Abdomen: Soft, gravid, appropriate for gestational age.  Pain/Pressure: Absent     Pelvic: Cervical exam deferred        Extremities: Normal range of motion.  Edema: None  Mental Status: Normal mood and affect. Normal behavior. Normal judgment and thought content.   Assessment and Plan:  Pregnancy: [id number] at [redacted]w[redacted]d 1. [redacted] weeks gestation of pregnancy  2. Supervision of high risk pregnancy, antepartum FHT and FH normal.  3. Antepartum multigravida of advanced maternal age ASA 81mg . EFW 40% on 6/24. Patient has declined serial 7/24 every 4 weeks due to cost. Has declined genetic testing.   Preterm labor symptoms and general obstetric precautions including but not limited to vaginal bleeding, contractions, leaking of fluid and fetal movement were reviewed in detail with the patient. Please refer to After  Visit Summary for other counseling recommendations.   No follow-ups on file.  Future Appointments  Date Time Provider Department Center  06/27/2022  9:15 AM 06/29/2022, MD [facility] None  07/11/2022 10:55 AM Anyanwu, 07/13/2022, MD [facility] None  07/18/2022  8:55 AM Anyanwu, 09/17/2022, MD [facility] None  07/25/2022  8:35 AM 09/24/2022, DO [facility] None  08/01/2022  9:15 AM 08/03/2022, DO [facility] None    Levie Heritage, DO

## 2022-06-23 NOTE — Progress Notes (Signed)
   PRENATAL VISIT NOTE  Subjective:  Pam Solis is a 41 y.o. T6L4650 at [redacted]w[redacted]d being seen today for ongoing prenatal care.  She is currently monitored for the following issues for this low-risk pregnancy and has Supervision of high risk pregnancy, antepartum and Antepartum multigravida of advanced maternal age on their problem list.  Patient reports no complaints.  Contractions: Not present. Vag. Bleeding: None.  Movement: Present. Denies leaking of fluid.   The following portions of the patient's history were reviewed and updated as appropriate: allergies, current medications, past family history, past medical history, past social history, past surgical history and problem list.   Objective:   Vitals:   06/27/22 0910  BP: 110/65  Pulse: 93  Weight: 181 lb (82.1 kg)    Fetal Status:     Movement: Present     General:  Alert, oriented and cooperative. Patient is in no acute distress.  Skin: Skin is warm and dry. No rash noted.   Cardiovascular: Normal heart rate noted  Respiratory: Normal respiratory effort, no problems with respiration noted  Abdomen: Soft, gravid, appropriate for gestational age.  Pain/Pressure: Absent     Pelvic: Cervical exam deferred        Extremities: Normal range of motion.  Edema: None  Mental Status: Normal mood and affect. Normal behavior. Normal judgment and thought content.   Assessment and Plan:  Pregnancy: P5W6568 at [redacted]w[redacted]d 1. Supervision of high risk pregnancy, antepartum Anticipate cultures next appt Has done nitrous with first delivery - would want again if possible.   2. Antepartum multigravida of advanced maternal age Normal growth on 6/24 - 40%ile  Preterm labor symptoms and general obstetric precautions including but not limited to vaginal bleeding, contractions, leaking of fluid and fetal movement were reviewed in detail with the patient. Please refer to After Visit Summary for other counseling recommendations.   No follow-ups on  file.  Future Appointments  Date Time Provider Department Center  06/27/2022  9:15 AM Milas Hock, MD [facility] None  07/11/2022 10:55 AM Anyanwu, Jethro Bastos, MD [facility] None  07/18/2022  8:55 AM Anyanwu, Jethro Bastos, MD [facility] None  07/25/2022  8:35 AM Levie Heritage, DO [facility] None  08/01/2022  9:15 AM Levie Heritage, DO [facility] None    Milas Hock, MD

## 2022-06-27 ENCOUNTER — Encounter: Payer: Self-pay | Admitting: Obstetrics and Gynecology

## 2022-06-27 ENCOUNTER — Ambulatory Visit (INDEPENDENT_AMBULATORY_CARE_PROVIDER_SITE_OTHER): Payer: No Typology Code available for payment source | Admitting: Obstetrics and Gynecology

## 2022-06-27 VITALS — BP 110/65 | HR 93 | Wt 181.0 lb

## 2022-06-27 DIAGNOSIS — O0990 Supervision of high risk pregnancy, unspecified, unspecified trimester: Secondary | ICD-10-CM

## 2022-06-27 DIAGNOSIS — O09523 Supervision of elderly multigravida, third trimester: Secondary | ICD-10-CM

## 2022-06-27 DIAGNOSIS — Z3A34 34 weeks gestation of pregnancy: Secondary | ICD-10-CM

## 2022-06-27 DIAGNOSIS — O0993 Supervision of high risk pregnancy, unspecified, third trimester: Secondary | ICD-10-CM

## 2022-06-27 DIAGNOSIS — O26893 Other specified pregnancy related conditions, third trimester: Secondary | ICD-10-CM

## 2022-06-27 DIAGNOSIS — O09529 Supervision of elderly multigravida, unspecified trimester: Secondary | ICD-10-CM

## 2022-06-27 DIAGNOSIS — Z6791 Unspecified blood type, Rh negative: Secondary | ICD-10-CM

## 2022-07-11 ENCOUNTER — Encounter: Payer: Self-pay | Admitting: Obstetrics & Gynecology

## 2022-07-11 ENCOUNTER — Other Ambulatory Visit (HOSPITAL_COMMUNITY)
Admission: RE | Admit: 2022-07-11 | Discharge: 2022-07-11 | Disposition: A | Discharge status: Home / Self Care | Payer: No Typology Code available for payment source | Source: Ambulatory Visit | Attending: Obstetrics & Gynecology | Admitting: Obstetrics & Gynecology

## 2022-07-11 ENCOUNTER — Ambulatory Visit (INDEPENDENT_AMBULATORY_CARE_PROVIDER_SITE_OTHER): Payer: No Typology Code available for payment source | Admitting: Obstetrics & Gynecology

## 2022-07-11 VITALS — BP 97/65 | HR 79 | Wt 183.0 lb

## 2022-07-11 DIAGNOSIS — O0993 Supervision of high risk pregnancy, unspecified, third trimester: Secondary | ICD-10-CM

## 2022-07-11 DIAGNOSIS — O0990 Supervision of high risk pregnancy, unspecified, unspecified trimester: Secondary | ICD-10-CM

## 2022-07-11 DIAGNOSIS — Z3A36 36 weeks gestation of pregnancy: Secondary | ICD-10-CM

## 2022-07-11 DIAGNOSIS — O09523 Supervision of elderly multigravida, third trimester: Secondary | ICD-10-CM

## 2022-07-11 DIAGNOSIS — O09529 Supervision of elderly multigravida, unspecified trimester: Secondary | ICD-10-CM

## 2022-07-11 NOTE — Progress Notes (Signed)
   PRENATAL VISIT NOTE  Subjective:  Pam Solis is a 41 y.o. O2V0350 at [redacted]w[redacted]d being seen today for ongoing prenatal care.  She is currently monitored for the following issues for this high-risk pregnancy and has Supervision of high risk pregnancy, antepartum; Antepartum multigravida of advanced maternal age; and Rh negative status during pregnancy on their problem list.  Patient reports occasional contractions.  Contractions: Not present. Vag. Bleeding: None.  Movement: Present. Denies leaking of fluid.   The following portions of the patient's history were reviewed and updated as appropriate: allergies, current medications, past family history, past medical history, past social history, past surgical history and problem list.   Objective:   Vitals:   07/11/22 1058  BP: 97/65  Pulse: 79  Weight: 183 lb (83 kg)    Fetal Status: Fetal Heart Rate (bpm): 158 Fundal Height: 35 cm Movement: Present  Presentation: Vertex  General:  Alert, oriented and cooperative. Patient is in no acute distress.  Skin: Skin is warm and dry. No rash noted.   Cardiovascular: Normal heart rate noted  Respiratory: Normal respiratory effort, no problems with respiration noted  Abdomen: Soft, gravid, appropriate for gestational age.  Pain/Pressure: Present     Pelvic: Cervical exam performed in the presence of a chaperone Dilation: 3 Effacement (%): 70 Station: -2. Pelvic cultures obtained.  Extremities: Normal range of motion.  Edema: None  Mental Status: Normal mood and affect. Normal behavior. Normal judgment and thought content.   Assessment and Plan:  Pregnancy: [id number] at [redacted]w[redacted]d 1. Antepartum multigravida of advanced maternal age 7. [redacted] weeks gestation of pregnancy 3. Supervision of high risk pregnancy, antepartum Favorable cervix on exam today. Cultures obtained, will follow up results and manage accordingly. - GC/Chlamydia probe amp ([place])not at [facility] - Strep Gp B NAA Labor symptoms and  general obstetric precautions including but not limited to vaginal bleeding, contractions, leaking of fluid and fetal movement were reviewed in detail with the patient. Please refer to After Visit Summary for other counseling recommendations.   Return in about 1 week (around 07/18/2022) for OFFICE OB VISIT (MD or APP).  Future Appointments  Date Time Provider Department Center  07/18/2022  8:55 AM Shahir Karen, Jethro Bastos, MD [facility] None  07/25/2022  8:35 AM Levie Heritage, DO [facility] None  08/01/2022  9:15 AM Adrian Blackwater, Rhona Raider, DO [facility] None    Jaynie Collins, MD

## 2022-07-11 NOTE — Patient Instructions (Signed)
Return to office for any scheduled appointments. Call the office or go to the MAU at [facility] [place] if: You begin to have strong, frequent contractions Your water breaks.  Sometimes it is a big gush of fluid, sometimes it is just a trickle that keeps getting your underwear wet or running down your legs You have vaginal bleeding.  It is normal to have a small amount of spotting if your cervix was checked.  You do not feel your baby moving like normal.  If you do not, get something to eat and drink and lay down and focus on feeling your baby move.   If your baby is still not moving like normal, you should call the office or go to MAU. Any other obstetric concerns.  

## 2022-07-13 LAB — STREP GP B NAA: Strep Gp B NAA: NEGATIVE

## 2022-07-14 LAB — GC/CHLAMYDIA PROBE AMP (~~LOC~~) NOT AT ARMC
Chlamydia: NEGATIVE
Comment: NEGATIVE
Comment: NORMAL
Neisseria Gonorrhea: NEGATIVE

## 2022-07-18 ENCOUNTER — Ambulatory Visit (INDEPENDENT_AMBULATORY_CARE_PROVIDER_SITE_OTHER): Payer: No Typology Code available for payment source | Admitting: Obstetrics & Gynecology

## 2022-07-18 VITALS — BP 108/71 | HR 73 | Wt 185.0 lb

## 2022-07-18 DIAGNOSIS — O09529 Supervision of elderly multigravida, unspecified trimester: Secondary | ICD-10-CM

## 2022-07-18 DIAGNOSIS — Z3A37 37 weeks gestation of pregnancy: Secondary | ICD-10-CM

## 2022-07-18 DIAGNOSIS — Z23 Encounter for immunization: Secondary | ICD-10-CM

## 2022-07-18 DIAGNOSIS — O0990 Supervision of high risk pregnancy, unspecified, unspecified trimester: Secondary | ICD-10-CM

## 2022-07-18 NOTE — Patient Instructions (Signed)
Return to office for any scheduled appointments. Call the office or go to the MAU at [facility] [place] if: You begin to have strong, frequent contractions Your water breaks.  Sometimes it is a big gush of fluid, sometimes it is just a trickle that keeps getting your underwear wet or running down your legs You have vaginal bleeding.  It is normal to have a small amount of spotting if your cervix was checked.  You do not feel your baby moving like normal.  If you do not, get something to eat and drink and lay down and focus on feeling your baby move.   If your baby is still not moving like normal, you should call the office or go to MAU. Any other obstetric concerns.  

## 2022-07-18 NOTE — Progress Notes (Signed)
   PRENATAL VISIT NOTE  Subjective:  Pam Solis is a 41 y.o. V7C5885 at [redacted]w[redacted]d being seen today for ongoing prenatal care.  She is currently monitored for the following issues for this high-risk pregnancy and has Supervision of high risk pregnancy, antepartum; Antepartum multigravida of advanced maternal age; and Rh negative status during pregnancy on their problem list.  Patient reports no complaints.  Contractions: Not present. Vag. Bleeding: None.  Movement: Present. Denies leaking of fluid.   The following portions of the patient's history were reviewed and updated as appropriate: allergies, current medications, past family history, past medical history, past social history, past surgical history and problem list.   Objective:   Vitals:   07/18/22 0903  BP: 108/71  Pulse: 73  Weight: 185 lb (83.9 kg)    Fetal Status: Fetal Heart Rate (bpm): 135 Fundal Height: 38 cm Movement: Present     General:  Alert, oriented and cooperative. Patient is in no acute distress.  Skin: Skin is warm and dry. No rash noted.   Cardiovascular: Normal heart rate noted  Respiratory: Normal respiratory effort, no problems with respiration noted  Abdomen: Soft, gravid, appropriate for gestational age.  Pain/Pressure: Present     Pelvic: Cervical exam deferred        Extremities: Normal range of motion.  Edema: None  Mental Status: Normal mood and affect. Normal behavior. Normal judgment and thought content.   Assessment and Plan:  Pregnancy: O2D7412 at [redacted]w[redacted]d 1. Need for Tdap vaccination - Tdap vaccine greater than or equal to 7yo IM  2. Antepartum multigravida of advanced maternal age 64. [redacted] weeks gestation of pregnancy 4. Supervision of high risk pregnancy, antepartum No other concerns.  Does not want to discuss induction until closer to due date.  Labor symptoms and general obstetric precautions including but not limited to vaginal bleeding, contractions, leaking of fluid and fetal movement  were reviewed in detail with the patient. Please refer to After Visit Summary for other counseling recommendations.   Return in about 1 week (around 07/25/2022) for OFFICE OB VISIT (MD only).  Future Appointments  Date Time Provider Department Center  07/25/2022  8:35 AM Levie Heritage, DO [facility] None  08/01/2022  9:15 AM Adrian Blackwater, Rhona Raider, DO [facility] None    Jaynie Collins, MD

## 2022-07-25 ENCOUNTER — Telehealth (INDEPENDENT_AMBULATORY_CARE_PROVIDER_SITE_OTHER): Payer: No Typology Code available for payment source | Admitting: Family Medicine

## 2022-07-25 DIAGNOSIS — O09529 Supervision of elderly multigravida, unspecified trimester: Secondary | ICD-10-CM

## 2022-07-25 DIAGNOSIS — O0990 Supervision of high risk pregnancy, unspecified, unspecified trimester: Secondary | ICD-10-CM

## 2022-07-25 DIAGNOSIS — O26893 Other specified pregnancy related conditions, third trimester: Secondary | ICD-10-CM

## 2022-07-25 DIAGNOSIS — Z6791 Unspecified blood type, Rh negative: Secondary | ICD-10-CM

## 2022-07-25 NOTE — Progress Notes (Signed)
   OBSTETRICS PRENATAL VIRTUAL VISIT ENCOUNTER NOTE  Provider location: Center for [facility]   Patient location: Home  I connected with Pam Solis on 07/25/22 at  8:35 AM EDT by MyChart Video Encounter and verified that I am speaking with the correct person using two identifiers. I discussed the limitations, risks, security and privacy concerns of performing an evaluation and management service virtually and the availability of in person appointments. I also discussed with the patient that there may be a patient responsible charge related to this service. The patient expressed understanding and agreed to proceed. Subjective:  Pam Solis is a 41 y.o. [id number] at [redacted]w[redacted]d being seen today for ongoing prenatal care.  She is currently monitored for the following issues for this high-risk pregnancy and has Supervision of high risk pregnancy, antepartum; Antepartum multigravida of advanced maternal age; and Rh negative status during pregnancy on their problem list.  Patient reports no complaints.  Contractions: Not present. Vag. Bleeding: None.  Movement: Present. Denies any leaking of fluid.   The following portions of the patient's history were reviewed and updated as appropriate: allergies, current medications, past family history, past medical history, past social history, past surgical history and problem list.   Objective:  There were no vitals filed for this visit.  Fetal Status:     Movement: Present     General:  Alert, oriented and cooperative. Patient is in no acute distress.  Respiratory: Normal respiratory effort, no problems with respiration noted  Mental Status: Normal mood and affect. Normal behavior. Normal judgment and thought content.  Rest of physical exam deferred due to type of encounter  Imaging: No results found.  Assessment and Plan:  Pregnancy: C8Y2233 at [redacted]w[redacted]d  1. Supervision of high risk pregnancy, antepartum FHT and FH  normal  2. Rh negative status during pregnancy in third trimester  3. Antepartum multigravida of advanced maternal age   Term labor symptoms and general obstetric precautions including but not limited to vaginal bleeding, contractions, leaking of fluid and fetal movement were reviewed in detail with the patient. I discussed the assessment and treatment plan with the patient. The patient was provided an opportunity to ask questions and all were answered. The patient agreed with the plan and demonstrated an understanding of the instructions. The patient was advised to call back or seek an in-person office evaluation/go to MAU at [facility] for any urgent or concerning symptoms. Please refer to After Visit Summary for other counseling recommendations.   I provided 10 minutes of face-to-face time during this encounter.  No follow-ups on file.  Future Appointments  Date Time Provider Department Center  08/01/2022  9:15 AM Levie Heritage, DO [facility] None    Levie Heritage, DO Center for [employer], [facility]

## 2022-07-27 ENCOUNTER — Encounter: Payer: Self-pay | Admitting: Family Medicine

## 2022-08-01 ENCOUNTER — Ambulatory Visit (INDEPENDENT_AMBULATORY_CARE_PROVIDER_SITE_OTHER): Payer: No Typology Code available for payment source | Admitting: Family Medicine

## 2022-08-01 ENCOUNTER — Encounter (HOSPITAL_COMMUNITY): Payer: Self-pay | Admitting: *Deleted

## 2022-08-01 ENCOUNTER — Telehealth (HOSPITAL_COMMUNITY): Payer: Self-pay | Admitting: *Deleted

## 2022-08-01 VITALS — BP 110/65 | HR 78 | Wt 188.0 lb

## 2022-08-01 DIAGNOSIS — O26893 Other specified pregnancy related conditions, third trimester: Secondary | ICD-10-CM

## 2022-08-01 DIAGNOSIS — Z3A39 39 weeks gestation of pregnancy: Secondary | ICD-10-CM

## 2022-08-01 DIAGNOSIS — Z6791 Unspecified blood type, Rh negative: Secondary | ICD-10-CM

## 2022-08-01 DIAGNOSIS — O0990 Supervision of high risk pregnancy, unspecified, unspecified trimester: Secondary | ICD-10-CM

## 2022-08-01 DIAGNOSIS — O09529 Supervision of elderly multigravida, unspecified trimester: Secondary | ICD-10-CM

## 2022-08-01 NOTE — Telephone Encounter (Signed)
Preadmission screen  

## 2022-08-01 NOTE — Progress Notes (Signed)
   PRENATAL VISIT NOTE  Subjective:  Pam Solis is a 41 y.o. W2H8527 at [redacted]w[redacted]d being seen today for ongoing prenatal care.  She is currently monitored for the following issues for this high-risk pregnancy and has Supervision of high risk pregnancy, antepartum; Antepartum multigravida of advanced maternal age; and Rh negative status during pregnancy on their problem list.  Patient reports no complaints.  Contractions: Not present. Vag. Bleeding: None.  Movement: Present. Denies leaking of fluid.   The following portions of the patient's history were reviewed and updated as appropriate: allergies, current medications, past family history, past medical history, past social history, past surgical history and problem list.   Objective:   Vitals:   08/01/22 0910  BP: 110/65  Pulse: 78  Weight: 188 lb (85.3 kg)    Fetal Status: Fetal Heart Rate (bpm): 121   Movement: Present     General:  Alert, oriented and cooperative. Patient is in no acute distress.  Skin: Skin is warm and dry. No rash noted.   Cardiovascular: Normal heart rate noted  Respiratory: Normal respiratory effort, no problems with respiration noted  Abdomen: Soft, gravid, appropriate for gestational age.  Pain/Pressure: Present     Pelvic: Cervical exam deferred        Extremities: Normal range of motion.  Edema: None  Mental Status: Normal mood and affect. Normal behavior. Normal judgment and thought content.   Assessment and Plan:  Pregnancy: P8E4235 at [redacted]w[redacted]d 1. [redacted] weeks gestation of pregnancy  2. Supervision of high risk pregnancy, antepartum FHT and FH normal Induction this coming Tuesday for term.  3. Antepartum multigravida of advanced maternal age ASA 81mg   4. Rh negative status during pregnancy in third trimester  Term labor symptoms and general obstetric precautions including but not limited to vaginal bleeding, contractions, leaking of fluid and fetal movement were reviewed in detail with the  patient. Please refer to After Visit Summary for other counseling recommendations.   No follow-ups on file.  Future Appointments  Date Time Provider Department Center  08/05/2022  7:30 AM [facility] ROOM [facility] None    [facility], DO

## 2022-08-05 ENCOUNTER — Other Ambulatory Visit: Payer: Self-pay

## 2022-08-05 ENCOUNTER — Inpatient Hospital Stay (HOSPITAL_COMMUNITY): Payer: No Typology Code available for payment source

## 2022-08-05 ENCOUNTER — Inpatient Hospital Stay (HOSPITAL_COMMUNITY)
Admission: AD | Admit: 2022-08-05 | Discharge: 2022-08-07 | DRG: 807 | Disposition: A | Discharge status: Home / Self Care | Payer: No Typology Code available for payment source | Attending: Family Medicine | Admitting: Family Medicine

## 2022-08-05 ENCOUNTER — Encounter (HOSPITAL_COMMUNITY): Payer: Self-pay | Admitting: Family Medicine

## 2022-08-05 DIAGNOSIS — O326XX1 Maternal care for compound presentation, fetus 1: Secondary | ICD-10-CM | POA: Diagnosis not present

## 2022-08-05 DIAGNOSIS — O480 Post-term pregnancy: Secondary | ICD-10-CM | POA: Diagnosis present

## 2022-08-05 DIAGNOSIS — Z3A40 40 weeks gestation of pregnancy: Secondary | ICD-10-CM | POA: Diagnosis not present

## 2022-08-05 DIAGNOSIS — Z7982 Long term (current) use of aspirin: Secondary | ICD-10-CM

## 2022-08-05 DIAGNOSIS — O0990 Supervision of high risk pregnancy, unspecified, unspecified trimester: Secondary | ICD-10-CM

## 2022-08-05 DIAGNOSIS — O26893 Other specified pregnancy related conditions, third trimester: Secondary | ICD-10-CM | POA: Diagnosis present

## 2022-08-05 DIAGNOSIS — Z6791 Unspecified blood type, Rh negative: Secondary | ICD-10-CM | POA: Diagnosis not present

## 2022-08-05 DIAGNOSIS — Z3490 Encounter for supervision of normal pregnancy, unspecified, unspecified trimester: Principal | ICD-10-CM | POA: Diagnosis present

## 2022-08-05 LAB — CBC
HCT: 29.5 % — ABNORMAL LOW (ref 36.0–46.0)
Hemoglobin: 8.7 g/dL — ABNORMAL LOW (ref 12.0–15.0)
MCH: 20.6 pg — ABNORMAL LOW (ref 26.0–34.0)
MCHC: 29.5 g/dL — ABNORMAL LOW (ref 30.0–36.0)
MCV: 69.9 fL — ABNORMAL LOW (ref 80.0–100.0)
Platelets: 307 10*3/uL (ref 150–400)
RBC: 4.22 MIL/uL (ref 3.87–5.11)
RDW: 17.9 % — ABNORMAL HIGH (ref 11.5–15.5)
WBC: 11.4 10*3/uL — ABNORMAL HIGH (ref 4.0–10.5)
nRBC: 0 % (ref 0.0–0.2)

## 2022-08-05 LAB — TYPE AND SCREEN
ABO/RH(D): A NEG
Antibody Screen: NEGATIVE

## 2022-08-05 MED ORDER — ONDANSETRON HCL 4 MG/2ML IJ SOLN: 4.0000 mg | Freq: Four times a day (QID) | INTRAMUSCULAR | Status: DC | PRN

## 2022-08-05 MED ORDER — OXYTOCIN BOLUS FROM INFUSION
333.0000 mL | Freq: Once | INTRAVENOUS | Status: AC
  Administered 2022-08-06: 333 mL via INTRAVENOUS

## 2022-08-05 MED ORDER — OXYCODONE-ACETAMINOPHEN 5-325 MG PO TABS: 1.0000 | ORAL_TABLET | ORAL | Status: DC | PRN

## 2022-08-05 MED ORDER — OXYTOCIN-SODIUM CHLORIDE 30-0.9 UT/500ML-% IV SOLN
1.0000 m[IU]/min | INTRAVENOUS | Status: DC
  Administered 2022-08-05: 2 m[IU]/min via INTRAVENOUS

## 2022-08-05 MED ORDER — LIDOCAINE HCL (PF) 1 % IJ SOLN
30.0000 mL | INTRAMUSCULAR | Status: AC | PRN
Start: 2022-08-05 — End: 2022-08-06
  Administered 2022-08-06: 30 mL via SUBCUTANEOUS
  Filled 2022-08-05: qty 30

## 2022-08-05 MED ORDER — FENTANYL CITRATE (PF) 100 MCG/2ML IJ SOLN: 100.0000 ug | INTRAMUSCULAR | Status: DC | PRN

## 2022-08-05 MED ORDER — TERBUTALINE SULFATE 1 MG/ML IJ SOLN: 0.2500 mg | Freq: Once | INTRAMUSCULAR | Status: DC | PRN

## 2022-08-05 MED ORDER — OXYTOCIN-SODIUM CHLORIDE 30-0.9 UT/500ML-% IV SOLN
1.0000 m[IU]/min | INTRAVENOUS | Status: DC
  Filled 2022-08-05: qty 500

## 2022-08-05 MED ORDER — OXYTOCIN-SODIUM CHLORIDE 30-0.9 UT/500ML-% IV SOLN
2.5000 [IU]/h | INTRAVENOUS | Status: DC
  Administered 2022-08-06: 2.5 [IU]/h via INTRAVENOUS

## 2022-08-05 MED ORDER — SOD CITRATE-CITRIC ACID 500-334 MG/5ML PO SOLN: 30.0000 mL | ORAL | Status: DC | PRN

## 2022-08-05 MED ORDER — LACTATED RINGERS IV SOLN: 500.0000 mL | INTRAVENOUS | Status: DC | PRN

## 2022-08-05 MED ORDER — LACTATED RINGERS IV SOLN: INTRAVENOUS | Status: DC

## 2022-08-05 MED ORDER — ACETAMINOPHEN 325 MG PO TABS: 650.0000 mg | ORAL_TABLET | ORAL | Status: DC | PRN

## 2022-08-05 MED ORDER — OXYCODONE-ACETAMINOPHEN 5-325 MG PO TABS: 2.0000 | ORAL_TABLET | ORAL | Status: DC | PRN

## 2022-08-05 NOTE — H&P (Signed)
OBSTETRIC ADMISSION HISTORY AND PHYSICAL  Pam Solis is a 41 y.o. female [phone] with IUP at [redacted]w[redacted]d by LMP presenting for term IOL. She reports +FMs, No LOF, no VB, no blurry vision, headaches or peripheral edema, and RUQ pain.  She plans on breast feeding. Her partner is planning a vasectomy for birth control. She received her prenatal care at  [facility]    Dating: By LMP --->  Estimated Date of Delivery: 08/03/22  Prenatal History/Complications: AMA  Past Medical History: Past Medical History:  Diagnosis Date   Medical history non-contributory     Past Surgical History: Past Surgical History:  Procedure Laterality Date   WISDOM TOOTH EXTRACTION  2010    Obstetrical History: OB History     Gravida  5   Para  2   Term  2   Preterm      AB  2   Living  2      SAB  2   IAB      Ectopic      Multiple      Live Births  2           Social History Social History   Socioeconomic History   Marital status: Married    Spouse name: Pam Solis   Number of children: 2   Years of education: Not on file   Highest education level: Professional school degree (e.g., MD, DDS, DVM, JD)  Occupational History   Not on file  Tobacco Use   Smoking status: Never   Smokeless tobacco: Never  Vaping Use   Vaping Use: Never used  Substance and Sexual Activity   Alcohol use: Yes    Comment: once a month   Drug use: Never   Sexual activity: Yes    Comment: has used natural fam planning model before  Other Topics Concern   Not on file  Social History Narrative   Not on file   Social Determinants of Health   Financial Resource Strain: Not on file  Food Insecurity: Not on file  Transportation Needs: Not on file  Physical Activity: Not on file  Stress: Not on file  Social Connections: Not on file    Family History: Family History  Problem Relation Age of Onset   Diabetes Paternal Grandfather    Diabetes Paternal Grandmother    Breast cancer Paternal Grandmother     Cancer Paternal Sharol Roussel' disease Mother    Cancer Maternal Uncle     Allergies: No Known Allergies  Medications Prior to Admission  Medication Sig Dispense Refill Last Dose   aspirin 81 MG chewable tablet Chew by mouth daily.      FERROUS SULFATE PO Take by mouth.      Prenatal Vit-Fe Fumarate-FA (MULTIVITAMIN-PRENATAL) 27-0.8 MG TABS tablet Take 1 tablet by mouth daily at 12 noon.      Review of Systems   All systems reviewed and negative except as stated in HPI  Height 5\' 6"  (1.676 m), weight 85.5 kg, last menstrual period 10/27/2021. General appearance: alert, cooperative, and no distress Lungs: clear to auscultation bilaterally Heart: regular rate and rhythm Abdomen: soft, non-tender; bowel sounds normal Pelvic: n/a Extremities: Homans sign is negative, no sign of DVT DTR's +2 Presentation: cephalic Fetal monitoringBaseline: 120 bpm, Variability: Good {> 6 bpm), Accelerations: Reactive, and Decelerations: Absent Uterine activity: occasional uc's     Prenatal labs: ABO, Rh: A/Negative/-- (01/19 1030) Antibody: Negative (01/19 1030) Rubella: 10.30 (01/19 1030) RPR: Non Reactive (05/19  1027)  HBsAg: Negative (01/19 1030)  HIV: Non Reactive (05/19 0835)  GBS: Negative/-- (07/28 1114)   Prenatal Transfer Tool  Maternal Diabetes: No Genetic Screening: Normal Maternal Ultrasounds/Referrals: Normal Fetal Ultrasounds or other Referrals:  None Maternal Substance Abuse:  No Significant Maternal Medications:  None Significant Maternal Lab Results: Group B Strep negative  No results found for this or any previous visit (from the past 24 hour(s)).  Patient Active Problem List   Diagnosis Date Noted   Encounter for induction of labor 08/05/2022   Rh negative status during pregnancy 06/27/2022   Supervision of high risk pregnancy, antepartum 01/02/2022   Antepartum multigravida of advanced maternal age 70/19/2023    Assessment/Plan:  Pam Solis  is a 41 y.o. O5D6644 at [redacted]w[redacted]d here for term IOL  #Labor: Orders from office were for 4x4 pitocin. Patient excellent candidate and CNM discussed risks and benefits of 4x4 pit. Patient reports her last labor felt "too fast" and she prefers to go with 2x2. Discussed AROM with regular contractions and patient agreeable to plan of care.  Patient reports rapid labors with both other deliveries. Planning on nitrous for pain relief- used in first labor and liked it.   #Pain: Per patient request #FWB: Cat 1 #ID:  GBS [id number] #MOF: breast #MOC: vasectomy #Circ:  n/a  Rolm Bookbinder, CNM  08/05/2022, 5:58 PM

## 2022-08-05 NOTE — Progress Notes (Signed)
Labor Progress Note Pam Solis is a 41 y.o. E7M0947 at [redacted]w[redacted]d presented for IOL S: Patient reports water broke. Feeling more pain  O:  BP (!) 93/59   Pulse 75   Temp 97.7 F (36.5 C) (Oral)   Resp 18   Ht 5\' 6"  (1.676 m)   Wt 85.5 kg   LMP 10/27/2021   BMI 30.42 kg/m  EFM: 105bpm/Moderate variability/ 15x15 accels/ None decels   CVE: Dilation: 4 Effacement (%): 70, 80 Cervical Position: Middle Station: -1 Presentation: Vertex Exam by:: Beitz RN   A&P: 41 y.o. 24 [redacted]w[redacted]d IOL #Labor: Progressing well. S/p SROMc at 1145 PM. Defer check at this time and continue Pit #Pain:  #FWB: Cat 1 #GBS negative RH negative  [redacted]w[redacted]d, MD [facility] FM PGY3 Visiting Resident  11:53 PM

## 2022-08-06 ENCOUNTER — Encounter (HOSPITAL_COMMUNITY): Payer: Self-pay | Admitting: Family Medicine

## 2022-08-06 DIAGNOSIS — O326XX1 Maternal care for compound presentation, fetus 1: Secondary | ICD-10-CM

## 2022-08-06 DIAGNOSIS — Z3A40 40 weeks gestation of pregnancy: Secondary | ICD-10-CM

## 2022-08-06 LAB — CBC
HCT: 22.9 % — ABNORMAL LOW (ref 36.0–46.0)
Hemoglobin: 7.1 g/dL — ABNORMAL LOW (ref 12.0–15.0)
MCH: 21.3 pg — ABNORMAL LOW (ref 26.0–34.0)
MCHC: 31 g/dL (ref 30.0–36.0)
MCV: 68.8 fL — ABNORMAL LOW (ref 80.0–100.0)
Platelets: 316 10*3/uL (ref 150–400)
RBC: 3.33 MIL/uL — ABNORMAL LOW (ref 3.87–5.11)
RDW: 18 % — ABNORMAL HIGH (ref 11.5–15.5)
WBC: 14.9 10*3/uL — ABNORMAL HIGH (ref 4.0–10.5)
nRBC: 0 % (ref 0.0–0.2)

## 2022-08-06 LAB — RPR: RPR Ser Ql: NONREACTIVE

## 2022-08-06 MED ORDER — COCONUT OIL OIL: 1.0000 | TOPICAL_OIL | Status: DC | PRN

## 2022-08-06 MED ORDER — ZOLPIDEM TARTRATE 5 MG PO TABS: 5.0000 mg | ORAL_TABLET | Freq: Every evening | ORAL | Status: DC | PRN

## 2022-08-06 MED ORDER — IBUPROFEN 600 MG PO TABS
600.0000 mg | ORAL_TABLET | Freq: Four times a day (QID) | ORAL | Status: DC
  Administered 2022-08-06 – 2022-08-07 (×6): 600 mg via ORAL
  Filled 2022-08-06 (×6): qty 1

## 2022-08-06 MED ORDER — DIBUCAINE (PERIANAL) 1 % EX OINT
1.0000 | TOPICAL_OINTMENT | CUTANEOUS | Status: DC | PRN
  Administered 2022-08-06: 1 via RECTAL
  Filled 2022-08-06: qty 28

## 2022-08-06 MED ORDER — ONDANSETRON HCL 4 MG/2ML IJ SOLN: 4.0000 mg | INTRAMUSCULAR | Status: DC | PRN

## 2022-08-06 MED ORDER — SENNOSIDES-DOCUSATE SODIUM 8.6-50 MG PO TABS
2.0000 | ORAL_TABLET | Freq: Every day | ORAL | Status: DC
  Administered 2022-08-07: 2 via ORAL
  Filled 2022-08-06: qty 2

## 2022-08-06 MED ORDER — DIPHENHYDRAMINE HCL 25 MG PO CAPS: 25.0000 mg | ORAL_CAPSULE | Freq: Four times a day (QID) | ORAL | Status: DC | PRN

## 2022-08-06 MED ORDER — WITCH HAZEL-GLYCERIN EX PADS
1.0000 | MEDICATED_PAD | CUTANEOUS | Status: DC | PRN
  Administered 2022-08-06: 1 via TOPICAL

## 2022-08-06 MED ORDER — SODIUM CHLORIDE 0.9 % IV SOLN: INTRAVENOUS | Status: DC | PRN

## 2022-08-06 MED ORDER — SODIUM CHLORIDE 0.9 % IV SOLN
500.0000 mg | Freq: Once | INTRAVENOUS | Status: AC
  Administered 2022-08-06: 500 mg via INTRAVENOUS
  Filled 2022-08-06: qty 25

## 2022-08-06 MED ORDER — SIMETHICONE 80 MG PO CHEW: 80.0000 mg | CHEWABLE_TABLET | ORAL | Status: DC | PRN

## 2022-08-06 MED ORDER — ACETAMINOPHEN 325 MG PO TABS: 650.0000 mg | ORAL_TABLET | ORAL | Status: DC | PRN

## 2022-08-06 MED ORDER — PRENATAL MULTIVITAMIN CH
1.0000 | ORAL_TABLET | Freq: Every day | ORAL | Status: DC
  Administered 2022-08-06 – 2022-08-07 (×2): 1 via ORAL
  Filled 2022-08-06 (×2): qty 1

## 2022-08-06 MED ORDER — TETANUS-DIPHTH-ACELL PERTUSSIS 5-2.5-18.5 LF-MCG/0.5 IM SUSY: 0.5000 mL | PREFILLED_SYRINGE | Freq: Once | INTRAMUSCULAR | Status: DC

## 2022-08-06 MED ORDER — BENZOCAINE-MENTHOL 20-0.5 % EX AERO
1.0000 | INHALATION_SPRAY | CUTANEOUS | Status: DC | PRN
  Administered 2022-08-06: 1 via TOPICAL
  Filled 2022-08-06: qty 56

## 2022-08-06 MED ORDER — ONDANSETRON HCL 4 MG PO TABS: 4.0000 mg | ORAL_TABLET | ORAL | Status: DC | PRN

## 2022-08-06 NOTE — Plan of Care (Signed)

## 2022-08-06 NOTE — Progress Notes (Signed)
Patient's hemoglobin was noted to be 7.1, down from 8.7 on admit. EBL at delivery was 500 mL. Patient currently asymptomatic. Deferring blood products at this time in favor of IV iron. Patient was amenable. Ordered 500 mg IV Venofer.

## 2022-08-06 NOTE — Discharge Summary (Shared)
Postpartum Discharge Summary  Date of Service updated***     Patient Name: Pam Solis DOB: Oct 12, 1981 MRN: [id number]  Date of admission: 08/05/2022 Delivery date:08/06/2022  Delivering provider: Alain Marion  Date of discharge: 08/06/2022  Admitting diagnosis: Encounter for induction of labor [Z34.90] Intrauterine pregnancy: [redacted]w[redacted]d     Secondary diagnosis:  Principal Problem:   Encounter for induction of labor  Additional problems: none    Discharge diagnosis: Term Pregnancy Delivered                                              Post partum procedures:{Postpartum procedures:23558} Augmentation: Pitocin Complications: None  Hospital course: Induction of Labor With Vaginal Delivery   41 y.o. yo G6Y4034 at [redacted]w[redacted]d was admitted to the hospital 08/05/2022 for induction of labor.  Indication for induction: Elective.  Patient had an uncomplicated labor course as follows: Membrane Rupture Time/Date: 11:45 PM ,08/05/2022   Delivery Method:Vaginal, Spontaneous  Episiotomy: None  Lacerations:    Details of delivery can be found in separate delivery note.  Patient had a routine postpartum course. Patient is discharged home 08/06/22.  Newborn Data: Birth date:08/06/2022  Birth time:12:20 AM  Gender:Female  Living status:Living  Apgars:9 ,9  Weight:   Magnesium Sulfate received: No BMZ received: No Rhophylac:{Rhophylac received:30440032} MMR:N/A T-DaP:Given prenatally Flu: N/A Transfusion:{Transfusion received:30440034}  Physical exam  Vitals:   08/05/22 2211 08/05/22 2239 08/05/22 2303 08/05/22 2338  BP: (!) 91/59 111/69 (!) 95/37 (!) 93/59  Pulse: 93 84 (!) 118 75  Resp:      Temp:    97.7 F (36.5 C)  TempSrc:    Oral  Weight:      Height:       General: {Exam; general:21111117} Lochia: {Desc; appropriate/inappropriate:30686::"appropriate"} Uterine Fundus: {Desc; firm/soft:30687} Incision: {Exam; incision:21111123} DVT Evaluation: {Exam; dvt:2111122} Labs: Lab  Results  Component Value Date   WBC 11.4 (H) 08/05/2022   HGB 8.7 (L) 08/05/2022   HCT 29.5 (L) 08/05/2022   MCV 69.9 (L) 08/05/2022   PLT 307 08/05/2022       No data to display         Edinburgh Score:     No data to display           After visit meds:  Allergies as of 08/06/2022   No Known Allergies   Med Rec must be completed prior to using this [facility]***        Discharge home in stable condition Infant Feeding: {Baby feeding:23562} Infant Disposition:{CHL IP OB HOME WITH VQQVZD:63875} Discharge instruction: per After Visit Summary and Postpartum booklet. Activity: Advance as tolerated. Pelvic rest for 6 weeks.  Diet: {OB IEPP:29518841} Future Appointments: Future Appointments  Date Time Provider [place]  09/19/2022 10:55 AM Truett Mainland, DO [facility] None   Follow up Visit: Roma Schanz, CNM  P [facility] Admin Please schedule this patient for PP visit in: 4-6  Low risk pregnancy complicated by: none  Delivery mode:  SVD  Anticipated Birth Control:  vasectomy  PP Procedures needed: none  Schedule Integrated BH visit: no  Provider: Any provider   08/06/2022 Roma Schanz, CNM

## 2022-08-06 NOTE — Lactation Note (Signed)
This note was copied from a baby's chart. Lactation Consultation Note  Patient Name: Pam Solis [id number] Date: 08/06/2022 Reason for consult: Initial assessment;Term (per Birth parent baby last fed at 0815 - 5-7 mins and then on the other side 2-3 mins. LC encouraged mom to call with feeding cues for Latch assessment.) Age:40 hours  Maternal Data    Feeding Mother's Current Feeding Choice: Breast Milk  Consult Status Consult Status: Follow-up Date: 08/06/22 Follow-up type: In-patient    [name] [name] 08/06/2022, 9:14 AM

## 2022-08-07 LAB — CBC WITH DIFFERENTIAL/PLATELET
Abs Immature Granulocytes: 0.32 10*3/uL — ABNORMAL HIGH (ref 0.00–0.07)
Basophils Absolute: 0.1 10*3/uL (ref 0.0–0.1)
Basophils Relative: 1 %
Eosinophils Absolute: 0.2 10*3/uL (ref 0.0–0.5)
Eosinophils Relative: 2 %
HCT: 24.4 % — ABNORMAL LOW (ref 36.0–46.0)
Hemoglobin: 7.3 g/dL — ABNORMAL LOW (ref 12.0–15.0)
Immature Granulocytes: 3 %
Lymphocytes Relative: 16 %
Lymphs Abs: 1.8 10*3/uL (ref 0.7–4.0)
MCH: 21 pg — ABNORMAL LOW (ref 26.0–34.0)
MCHC: 29.9 g/dL — ABNORMAL LOW (ref 30.0–36.0)
MCV: 70.1 fL — ABNORMAL LOW (ref 80.0–100.0)
Monocytes Absolute: 0.5 10*3/uL (ref 0.1–1.0)
Monocytes Relative: 5 %
Neutro Abs: 7.8 10*3/uL — ABNORMAL HIGH (ref 1.7–7.7)
Neutrophils Relative %: 73 %
Platelets: 311 10*3/uL (ref 150–400)
RBC: 3.48 MIL/uL — ABNORMAL LOW (ref 3.87–5.11)
RDW: 18.4 % — ABNORMAL HIGH (ref 11.5–15.5)
WBC: 10.7 10*3/uL — ABNORMAL HIGH (ref 4.0–10.5)
nRBC: 0.2 % (ref 0.0–0.2)

## 2022-08-07 MED ORDER — SENNOSIDES-DOCUSATE SODIUM 8.6-50 MG PO TABS: 2.0000 | ORAL_TABLET | Freq: Every day | ORAL | 0 refills | Status: AC

## 2022-08-07 MED ORDER — IBUPROFEN 600 MG PO TABS: 600.0000 mg | ORAL_TABLET | Freq: Four times a day (QID) | ORAL | 0 refills | Status: AC

## 2022-08-07 MED ORDER — ACETAMINOPHEN 325 MG PO TABS: 650.0000 mg | ORAL_TABLET | ORAL | 0 refills | Status: AC | PRN

## 2022-08-14 ENCOUNTER — Telehealth (HOSPITAL_COMMUNITY): Payer: Self-pay | Admitting: *Deleted

## 2022-08-14 NOTE — Telephone Encounter (Signed)
Attempted Hospital Discharge Follow-Up Call.  Left voice mail requesting that patient return RN's phone call if patient has any concerns or questions regarding herself or her baby.  

## 2022-09-03 ENCOUNTER — Telehealth: Payer: Self-pay

## 2022-09-03 DIAGNOSIS — F32A Depression, unspecified: Secondary | ICD-10-CM

## 2022-09-03 NOTE — Telephone Encounter (Signed)
Community health nurse called stating patient scored an 10 on [place] screening and she does not have any history of depression. Patient is scheduled to come back to office for postpartum visit on Oct 6th. Will place referral for patient to connect with [facility]- integrated behavorial health. Community connects nurse states patient answered no to harming herself or the baby. Kathrene Alu RN

## 2022-09-08 NOTE — BH Specialist Note (Signed)
Integrated Behavioral Health Initial In-Person Visit  MRN: [id number] Name: Pam Solis  Number of [place] Clinician visits: No data recorded Session Start time: No data recorded   Session End time: No data recorded Total time in minutes: No data recorded  Types of Service: Individual psychotherapy  Interpretor:No. Interpretor Name and Language: n/a   Warm Hand Off Completed.        Subjective: Pam Solis is a 41 y.o. female accompanied by  newborn daughter Patient was referred by Loma Boston, DO for positive depression. Patient reports the following symptoms/concerns: Adjusting to new motherhood again; sleeping and eating well with good support at home.  Objective: Mood: NA and Affect: Appropriate Risk of harm to self or others: No plan to harm self or others  Life Context: Family and Social: Pt lives with husband and three children School/Work: - Self-Care: - Life Changes: Recent childbirth  Patient and/or Family's Strengths/Protective Factors: Social connections, Social and Patent attorney, [place] supports in place (healthy food, safe environments, etc.), Sense of purpose, and Physical Health (exercise, healthy diet, medication compliance, etc.)  Goals Addressed: Patient will  Demonstrate ability to: Maintain healthy adjustment to current life circumstances   Progress towards Goals: Ongoing  Interventions: Interventions utilized: Functional Assessment of ADLs, Psychoeducation and/or Health Education, Link to [employer], and Supportive Reflection  Standardized Assessments completed: GAD-7 and PHQ 9  Patient and/or Family Response: Patient agrees with treatment plan.   Patient Centered Plan: Patient is on the following Treatment Plan(s):  IBH  Assessment: Patient currently experiencing Other specified counseling,postpartum mood check.   Patient may benefit from brief therapeutic intervention  today.  Plan: Follow up with behavioral health clinician on : Call Londen Lorge at [phone], as needed. Behavioral recommendations:  --Continue prioritizing healthy self-care (regular meals, adequate rest; allowing practical help from supportive friends and family) until at least postpartum medical appointment -Consider new mom support group as needed at either [email or web address] or [email or web address]   Referral(s): Community Resources:  new parent support  Garlan Fair, LCSW     09/12/2022    8:45 AM 07/11/2022   11:01 AM 05/02/2022    8:33 AM 01/02/2022    9:27 AM  Depression screen PHQ 2/9  Decreased Interest 0 0 0 1  Down, Depressed, Hopeless 1 0 0 1  PHQ - 2 Score 1 0 0 2  Altered sleeping 0 0 0 0  Tired, decreased energy 1 0 1 1  Change in appetite 0 0 0 0  Feeling bad or failure about yourself  1 0 0 1  Trouble concentrating 0 0 0 0  Moving slowly or fidgety/restless 0 0 0 0  Suicidal thoughts 0 0 0 0  PHQ-9 Score 3 0 1 4      09/12/2022    8:46 AM 07/11/2022   11:01 AM 05/02/2022    8:33 AM 01/02/2022    9:27 AM  GAD 7 : Generalized Anxiety Score  Nervous, Anxious, on Edge 1 0 0 0  Control/stop worrying 0 0 0 0  Worry too much - different things 0 0 1 1  Trouble relaxing 0 0 0 0  Restless 0 0 0 0  Easily annoyed or irritable 1 0 0 1  Afraid - awful might happen 1 0 1 1  Total GAD 7 Score 3 0 2 3

## 2022-09-12 ENCOUNTER — Ambulatory Visit: Payer: Self-pay | Admitting: Clinical

## 2022-09-12 DIAGNOSIS — Z7189 Other specified counseling: Secondary | ICD-10-CM

## 2022-09-12 NOTE — Patient Instructions (Signed)
[facility] [address] [place], [place] [address] [phone] (main office) [phone] (Sedalia Greeson's office)  New Parent Support Groups [email or web address]   

## 2022-09-19 ENCOUNTER — Encounter: Payer: Self-pay | Admitting: Family Medicine

## 2022-09-19 ENCOUNTER — Ambulatory Visit (INDEPENDENT_AMBULATORY_CARE_PROVIDER_SITE_OTHER): Payer: PRIVATE HEALTH INSURANCE | Admitting: Family Medicine

## 2022-09-19 NOTE — Progress Notes (Signed)
Post Partum Visit Note  Pam Solis is a 41 y.o. [phone] female who presents for a postpartum visit. She is 6 weeks postpartum following a normal spontaneous vaginal delivery.  I have fully reviewed the prenatal and intrapartum course. The delivery was at 40.2 gestational weeks.  Anesthesia: local. Postpartum course has been complicated by postpartum depression. Baby is doing well. Baby is feeding by breast. Bleeding no bleeding. Bowel function is normal. Bladder function is normal. Patient is not sexually active. Contraception method is vasectomy. Postpartum depression screening: borderline- score 7.   The pregnancy intention screening data noted above was reviewed. Potential methods of contraception were discussed. The patient elected to proceed with No data recorded.   Edinburgh Postnatal Depression Scale - 09/19/22 1057       Edinburgh Postnatal Depression Scale:  In the Past 7 Days   I have been able to laugh and see the funny side of things. 0    I have looked forward with enjoyment to things. 1    I have blamed myself unnecessarily when things went wrong. 0    I have been anxious or worried for no good reason. 2    I have felt scared or panicky for no good reason. 1    Things have been getting on top of me. 2    I have been so unhappy that I have had difficulty sleeping. 0    I have felt sad or miserable. 1    I have been so unhappy that I have been crying. 0    The thought of harming myself has occurred to me. 0    Edinburgh Postnatal Depression Scale Total 7             Health Maintenance Due  Topic Date Due   COVID-19 Vaccine (1) Never done   PAP SMEAR-Modifier  Never done   INFLUENZA VACCINE  Never done    The following portions of the patient's history were reviewed and updated as appropriate: allergies, current medications, past family history, past medical history, past social history, past surgical history, and problem list.  Review of Systems Pertinent  items are noted in HPI.  Objective:  BP 111/69   Pulse 69   Wt 167 lb (75.8 kg)   Breastfeeding Yes   BMI 26.95 kg/m    General:  alert, cooperative, and no distress   Breasts:  not indicated  Lungs: clear to auscultation bilaterally  Heart:  regular rate and rhythm, S1, S2 normal, no murmur, click, rub or gallop  Abdomen: soft, non-tender; bowel sounds normal; no masses,  no organomegaly   Wound N/a  GU exam:   Small ingrown hair on right groin - no erythema and non tender.        Assessment:   1. Postpartum exam   Plan:   Essential components of care per ACOG recommendations:  1.  Mood and well being: Patient with negative depression screening today. Reviewed local resources for support.  - Patient tobacco use? No.   - hx of drug use? No.    2. Infant care and feeding:  -Patient currently breastmilk feeding? Yes. Reviewed importance of draining breast regularly to support lactation.  -Social determinants of health (SDOH) reviewed in EPIC. No concerns  3. Sexuality, contraception and birth spacing - Patient does not want a pregnancy in the next year.  - Reviewed reproductive life planning. Reviewed contraceptive methods based on pt preferences and effectiveness.  Patient desired Vasectomy today.   -  Discussed birth spacing of 18 months  4. Sleep and fatigue -Encouraged family/partner/community support of 4 hrs of uninterrupted sleep to help with mood and fatigue  5. Physical Recovery  - Discussed patients delivery and complications. She describes her labor as good. - Patient had a Vaginal, no problems at delivery. Patient had a 2nd degree laceration. Perineal healing reviewed. Patient expressed understanding - Patient has urinary incontinence? No. - Patient is safe to resume physical and sexual activity  6.  Health Maintenance - HM due items addressed Yes - Last pap smear No results found for: "DIAGPAP" Pap smear not done at today's visit.  -Breast Cancer  screening indicated? No.   7. Chronic Disease/Pregnancy Condition follow up: None Supportive care for ingrown hair - warm compresses. Return if increasing in size or becomes inflamed.  - PCP follow up  [place] [facility] [place]

## 2024-09-06 ENCOUNTER — Other Ambulatory Visit: Payer: Self-pay | Admitting: Internal Medicine

## 2024-09-06 DIAGNOSIS — R21 Rash and other nonspecific skin eruption: Secondary | ICD-10-CM

## 2024-09-06 DIAGNOSIS — N6459 Other signs and symptoms in breast: Secondary | ICD-10-CM
